# Patient Record
Sex: Female | Born: 1990 | Race: White | Hispanic: No | Marital: Single | Smoking: Current every day smoker
Health system: Southern US, Community
[De-identification: ages and names within clinical notes are randomized; demographics above are authoritative.]

## PROBLEM LIST (undated history)

## (undated) HISTORY — PX: TONSILLECTOMY: SUR1361

---

## 2016-03-21 ENCOUNTER — Emergency Department (HOSPITAL_BASED_OUTPATIENT_CLINIC_OR_DEPARTMENT_OTHER)
Admission: EM | Admit: 2016-03-21 | Discharge: 2016-03-21 | Disposition: A | Discharge status: Home / Self Care | Payer: Self-pay | Attending: Emergency Medicine | Admitting: Emergency Medicine

## 2016-03-21 ENCOUNTER — Encounter (HOSPITAL_BASED_OUTPATIENT_CLINIC_OR_DEPARTMENT_OTHER): Payer: Self-pay | Admitting: *Deleted

## 2016-03-21 DIAGNOSIS — F17200 Nicotine dependence, unspecified, uncomplicated: Secondary | ICD-10-CM | POA: Insufficient documentation

## 2016-03-21 DIAGNOSIS — M545 Low back pain, unspecified: Secondary | ICD-10-CM

## 2016-03-21 LAB — URINALYSIS, ROUTINE W REFLEX MICROSCOPIC
BILIRUBIN URINE: NEGATIVE
Glucose, UA: NEGATIVE mg/dL
Hgb urine dipstick: NEGATIVE
KETONES UR: NEGATIVE mg/dL
NITRITE: NEGATIVE
PH: 6 (ref 5.0–8.0)
PROTEIN: NEGATIVE mg/dL
Specific Gravity, Urine: 1.017 (ref 1.005–1.030)

## 2016-03-21 LAB — URINALYSIS, MICROSCOPIC (REFLEX)

## 2016-03-21 MED ORDER — NAPROXEN 500 MG PO TABS: 500.0000 mg | ORAL_TABLET | Freq: Two times a day (BID) | ORAL | 1 refills | Status: AC

## 2016-03-21 MED ORDER — HYDROCODONE-ACETAMINOPHEN 5-325 MG PO TABS: 1.0000 | ORAL_TABLET | Freq: Four times a day (QID) | ORAL | 0 refills | Status: AC | PRN

## 2016-03-21 MED ORDER — CYCLOBENZAPRINE HCL 10 MG PO TABS: 10.0000 mg | ORAL_TABLET | Freq: Two times a day (BID) | ORAL | 0 refills | Status: AC | PRN

## 2016-03-21 NOTE — ED Triage Notes (Signed)
3 months ago her back started hurting. Pain goes into her right leg.

## 2016-03-21 NOTE — Discharge Instructions (Signed)
Take the Flexeril as directed. Take the full strength Naprosyn which is double what she been taken over-the-counter. Supplement with the hydrocodone as needed for pain. Rest off your feet as much as possible. Follow-up if not improving over the next week.

## 2016-03-21 NOTE — ED Provider Notes (Signed)
[facility]   CSN: [id number] Arrival date & time: 03/21/16  1552 By signing my name below, I, Denise Macias, attest that this documentation has been prepared under the direction and in the presence of Vanetta MuldersScott Nikolai Wilczak, MD . Electronically Signed: Levon HedgerElizabeth Macias, Scribe. 03/21/2016. 6:34 PM.   History   Chief Complaint Chief Complaint  Patient presents with  . Back Pain    HPI Denise Macias is a 26 y.o. female who presents to the Emergency Department complaining of persistent, gradually worsening, mioderate right lower back pain that does not radiate onset 10 weeks ago. Per pt, pain is worse at night while trying to sleep. She has taken aleve with no relief. No hx of back pain. Pt also reports associated headache. She denies use of blood thinners. Pt denies any fever, chills, rhinorrhea, sore throat, cough, visual disturbance, SOB, CP, n/v/d, abdominal pain, dysuria, hematuria, back pain, joint swelling, or headaches., weakness, or numbness. Pt has no other complaints or symptoms at this time.   The history is provided by the patient. No language interpreter was used.   History reviewed. No pertinent past medical history.  There are no active problems to display for this patient.  Past Surgical History:  Procedure Laterality Date  . TONSILLECTOMY      OB History    No data available      Home Medications    Prior to Admission medications   Medication Sig Start Date End Date Taking? Authorizing Provider  cyclobenzaprine (FLEXERIL) 10 MG tablet Take 1 tablet (10 mg total) by mouth 2 (two) times daily as needed for muscle spasms. 03/21/16   Vanetta MuldersScott Alizandra Loh, MD  HYDROcodone-acetaminophen (NORCO/VICODIN) 5-325 MG tablet Take 1-2 tablets by mouth every 6 (six) hours as needed for moderate pain. 03/21/16   Vanetta MuldersScott Adler Chartrand, MD  naproxen (NAPROSYN) 500 MG tablet Take 1 tablet (500 mg total) by mouth 2 (two) times daily. 03/21/16   Vanetta MuldersScott Keyani Rigdon, MD   Family  History No family history on file.  Social History Social History  Substance Use Topics  . Smoking status: Current Every Day Smoker  . Smokeless tobacco: Never Used  . Alcohol use Yes    Allergies   Patient has no known allergies.  Review of Systems Review of Systems  Constitutional: Negative for chills and fever.  HENT: Negative for rhinorrhea, sneezing and sore throat.   Eyes: Negative for visual disturbance.  Respiratory: Negative for cough and shortness of breath.   Cardiovascular: Negative for chest pain.  Gastrointestinal: Negative for abdominal pain, diarrhea, nausea and vomiting.  Genitourinary: Negative for dysuria and hematuria.  Musculoskeletal: Negative for back pain and joint swelling.  Skin: Negative for rash.  Neurological: Negative for headaches.  Hematological: Does not bruise/bleed easily.  Psychiatric/Behavioral: Negative for confusion.   Physical Exam Updated Vital Signs BP 136/91 (BP Location: Right Arm)   Pulse 87   Temp 98.3 F (36.8 C) (Oral)   Resp 18   Ht 5\' 5"  (1.651 m)   Wt 111.6 kg   LMP 02/25/2016   SpO2 100%   BMI 40.94 kg/m   Physical Exam  Constitutional: She is oriented to person, place, and time. She appears well-developed and well-nourished. No distress.  HENT:  Head: Normocephalic and atraumatic.  Moist mucus membranes  Eyes: Conjunctivae and EOM are normal. Pupils are equal, round, and reactive to light. No scleral icterus.  Cardiovascular: Normal rate and regular rhythm.   Pulmonary/Chest: Effort normal. No respiratory distress.  Abdominal: Soft.  Bowel sounds are normal. She exhibits no distension. There is no tenderness.  Musculoskeletal:  Lower midline lumbar tenderness  Neurological: She is alert and oriented to person, place, and time. No cranial nerve deficit or sensory deficit. She exhibits normal muscle tone. Coordination normal.  Skin: Skin is warm and dry.  Psychiatric: She has a normal mood and affect.  Nursing  note and vitals reviewed.  ED Treatments / Results  DIAGNOSTIC STUDIES:  Oxygen Saturation is 100% on RA, normal by my interpretation.    COORDINATION OF CARE:  6:25 PM Discussed treatment plan with pt at bedside and pt agreed to plan.  Labs (all labs ordered are listed, but only abnormal results are displayed) Labs Reviewed  URINALYSIS, ROUTINE W REFLEX MICROSCOPIC - Abnormal; Notable for the following:       Result Value   APPearance CLOUDY (*)    Leukocytes, UA SMALL (*)    All other components within normal limits  URINALYSIS, MICROSCOPIC (REFLEX) - Abnormal; Notable for the following:    Bacteria, UA MANY (*)    Squamous Epithelial / LPF 0-5 (*)    All other components within normal limits    EKG  EKG Interpretation None       Radiology No results found.  Procedures Procedures (including critical care time)  Medications Ordered in ED Medications - No data to display   Initial Impression / Assessment and Plan / ED Course  I have reviewed the triage vital signs and the nursing notes.  Pertinent labs & imaging results that were available during my care of the patient were reviewed by me and considered in my medical decision making (see chart for details).     Patient without evidence of any sciatica. Symptoms consistent with right-sided lower back pain. Will treat symptomatically. With rest and anti-inflammatories pain medicine and muscle relaxer. Work note provided.  Final Clinical Impressions(s) / ED Diagnoses   Final diagnoses:  Acute right-sided low back pain without sciatica    New Prescriptions New Prescriptions   CYCLOBENZAPRINE (FLEXERIL) 10 MG TABLET    Take 1 tablet (10 mg total) by mouth 2 (two) times daily as needed for muscle spasms.   HYDROCODONE-ACETAMINOPHEN (NORCO/VICODIN) 5-325 MG TABLET    Take 1-2 tablets by mouth every 6 (six) hours as needed for moderate pain.   NAPROXEN (NAPROSYN) 500 MG TABLET    Take 1 tablet (500 mg total) by  mouth 2 (two) times daily.   I personally performed the services described in this documentation, which was scribed in my presence. The recorded information has been reviewed and is accurate.      Vanetta Mulders, MD 03/21/16 Paulo Fruit

## 2016-03-21 NOTE — ED Notes (Addendum)
Pt. Sitting on edge of bed and visitor standing in door way.  In to explain reason for wait and that I have not been in to see her.  Pt. Appologized for her behavior.

## 2016-05-24 ENCOUNTER — Emergency Department (HOSPITAL_COMMUNITY): Payer: Self-pay

## 2016-05-24 ENCOUNTER — Emergency Department (HOSPITAL_COMMUNITY)
Admission: EM | Admit: 2016-05-24 | Discharge: 2016-05-24 | Disposition: A | Discharge status: Home / Self Care | Payer: Self-pay | Attending: Emergency Medicine | Admitting: Emergency Medicine

## 2016-05-24 ENCOUNTER — Encounter (HOSPITAL_COMMUNITY): Payer: Self-pay

## 2016-05-24 DIAGNOSIS — Z79899 Other long term (current) drug therapy: Secondary | ICD-10-CM | POA: Insufficient documentation

## 2016-05-24 DIAGNOSIS — F17200 Nicotine dependence, unspecified, uncomplicated: Secondary | ICD-10-CM | POA: Insufficient documentation

## 2016-05-24 DIAGNOSIS — S93401A Sprain of unspecified ligament of right ankle, initial encounter: Secondary | ICD-10-CM | POA: Insufficient documentation

## 2016-05-24 DIAGNOSIS — Y92009 Unspecified place in unspecified non-institutional (private) residence as the place of occurrence of the external cause: Secondary | ICD-10-CM | POA: Insufficient documentation

## 2016-05-24 DIAGNOSIS — M25571 Pain in right ankle and joints of right foot: Secondary | ICD-10-CM

## 2016-05-24 DIAGNOSIS — W109XXA Fall (on) (from) unspecified stairs and steps, initial encounter: Secondary | ICD-10-CM | POA: Insufficient documentation

## 2016-05-24 DIAGNOSIS — Y999 Unspecified external cause status: Secondary | ICD-10-CM | POA: Insufficient documentation

## 2016-05-24 DIAGNOSIS — Y9301 Activity, walking, marching and hiking: Secondary | ICD-10-CM | POA: Insufficient documentation

## 2016-05-24 MED ORDER — HYDROCODONE-ACETAMINOPHEN 5-325 MG PO TABS
1.0000 | ORAL_TABLET | Freq: Once | ORAL | Status: AC
  Administered 2016-05-24: 1 via ORAL
  Filled 2016-05-24: qty 1

## 2016-05-24 MED ORDER — HYDROCODONE-ACETAMINOPHEN 5-325 MG PO TABS: 1.0000 | ORAL_TABLET | Freq: Four times a day (QID) | ORAL | 0 refills | Status: AC | PRN

## 2016-05-24 MED ORDER — NAPROXEN 500 MG PO TABS: 500.0000 mg | ORAL_TABLET | Freq: Two times a day (BID) | ORAL | 0 refills | Status: AC | PRN

## 2016-05-24 NOTE — ED Provider Notes (Signed)
[facility]-EMERGENCY DEPT Provider Note   CSN: [id number] Arrival date & time: 05/24/16  1207   By signing my name below, I, Clarisse Gouge, attest that this documentation has been prepared under the direction and in the presence of [address], [employer]. Electronically Signed: Clarisse Gouge, Scribe. 05/24/16. 12:44 PM.  History   Chief Complaint Chief Complaint  Patient presents with  . Ankle Pain    RIGHT   The history is provided by the patient and medical records. No language interpreter was used.  Ankle Pain   The incident occurred less than 1 hour ago. The incident occurred at home. The injury mechanism was a fall and torsion. The pain is present in the right ankle. The quality of the pain is described as throbbing. The pain is at a severity of 8/10. The pain is severe. The pain has been intermittent since onset. Associated symptoms include inability to bear weight, loss of motion (due to pain) and tingling. Pertinent negatives include no numbness, no muscle weakness and no loss of sensation. She reports no foreign bodies present. The symptoms are aggravated by bearing weight. She has tried nothing for the symptoms. The treatment provided no relief.    HPI Comments: Denise Macias is a 26 y.o. female who presents to the Emergency Department complaining of mechanical fall off the last step on a staircase when she twisted her ankle walking down the stairs ~1hr PTA, with associated sudden onset R ankle pain. She states she rolled her R ankle inward while stepping forward onto the last step of a stairway. She reports the pain is 8/10, intermittent, throbbing nonradiating R ankle pain worsened with placing pressure on the heel and weight bearing,  and with no tx tried PTA. She reports associated R ankle swelling, R ankle bruising, and mild tingling in her R foot/toes, as well as pain with ambulation and ROM of the ankle. She adds Hx of a broken L ankle, no prior injury to the R ankle. Pt denies  head trauma, LOC, knee pain, other arthralgias/injuries, neck/back pain, abrasions, numbness, focal weakness, or any other complaints at this time. NKDA.   History reviewed. No pertinent past medical history.  There are no active problems to display for this patient.   Past Surgical History:  Procedure Laterality Date  . TONSILLECTOMY      OB History    No data available       Home Medications    Prior to Admission medications   Medication Sig Start Date End Date Taking? Authorizing Provider  cyclobenzaprine (FLEXERIL) 10 MG tablet Take 1 tablet (10 mg total) by mouth 2 (two) times daily as needed for muscle spasms. 03/21/16   Vanetta Mulders, MD  HYDROcodone-acetaminophen (NORCO/VICODIN) 5-325 MG tablet Take 1-2 tablets by mouth every 6 (six) hours as needed for moderate pain. 03/21/16   Vanetta Mulders, MD  naproxen (NAPROSYN) 500 MG tablet Take 1 tablet (500 mg total) by mouth 2 (two) times daily. 03/21/16   Vanetta Mulders, MD    Family History History reviewed. No pertinent family history.  Social History Social History  Substance Use Topics  . Smoking status: Current Every Day Smoker  . Smokeless tobacco: Never Used  . Alcohol use Yes     Allergies   Patient has no known allergies.   Review of Systems Review of Systems  HENT: Negative for facial swelling (no head injury).   Musculoskeletal: Positive for arthralgias (R ankle) and joint swelling (R ankle). Negative for back pain, myalgias and  neck pain.  Skin: Positive for color change (bruising R ankle). Negative for wound.  Allergic/Immunologic: Negative for immunocompromised state.  Neurological: Positive for tingling. Negative for syncope, weakness and numbness.  Psychiatric/Behavioral: Negative for confusion.  10 Systems reviewed and are negative for acute change except as noted in the HPI.    Physical Exam Updated Vital Signs BP (!) 153/97   Pulse (!) 116   Temp 98.5 F (36.9 C) (Oral)   Resp 18    Ht  (1.676 m)   Wt 250 lb (113.4 kg)   LMP 05/16/2016   SpO2 98%   BMI 40.35 kg/m   Physical Exam  Constitutional: She is oriented to person, place, and time. Vital signs are normal. She appears well-developed and well-nourished.  Non-toxic appearance. No distress.  Afebrile, nontoxic, NAD although tearful during ankle exam d/t pain  HENT:  Head: Normocephalic and atraumatic.  Mouth/Throat: Mucous membranes are normal.  Eyes: Conjunctivae and EOM are normal. Right eye exhibits no discharge. Left eye exhibits no discharge.  Neck: Normal range of motion. Neck supple.  Cardiovascular: Intact distal pulses.  Tachycardia present.   Mildly tachycardic likely d/t pain  Pulmonary/Chest: Effort normal. No respiratory distress.  Abdominal: Normal appearance. She exhibits no distension.  Musculoskeletal:       Right ankle: She exhibits decreased range of motion (d/t pain), swelling and ecchymosis. She exhibits no deformity, no laceration and normal pulse. Tenderness. Lateral malleolus and medial malleolus tenderness found. Achilles tendon normal.  R ankle with limited ROM d/t pain, +swelling, no crepitus or deformity, with moderate TTP of the lateral and medial malleoli, but no TTP or swelling of fore foot or calf. No break in skin. +bruising without erythema. No warmth. Achilles intact. Good pedal pulse and cap refill of all toes. Wiggling toes without difficulty. Sensation grossly intact. Soft compartments   Neurological: She is alert and oriented to person, place, and time. She has normal strength. No sensory deficit.  Skin: Skin is warm, dry and intact. No rash noted.  Psychiatric: She has a normal mood and affect. Her behavior is normal.  Nursing note and vitals reviewed.    ED Treatments / Results  DIAGNOSTIC STUDIES: Oxygen Saturation is 98% on RA, normal by my interpretation.    COORDINATION OF CARE: 12:40 PM Discussed treatment plan with pt at bedside and pt agreed to plan. Will  order medication and imaging.  Labs (all labs ordered are listed, but only abnormal results are displayed) Labs Reviewed - No data to display  EKG  EKG Interpretation None       Radiology Dg Ankle Complete Right  Result Date: 05/24/2016 CLINICAL DATA:  Pain and swelling of right ankle.  Rolled ankle. EXAM: RIGHT ANKLE - COMPLETE 3+ VIEW COMPARISON:  None. FINDINGS: Lateral soft tissue swelling. No fracture, subluxation or dislocation. Joint spaces are maintained. IMPRESSION: No bony abnormality. Electronically Signed   By: Charlett Nose M.D.   On: 05/24/2016 13:02    Procedures Procedures (including critical care time)  Medications Ordered in ED Medications  HYDROcodone-acetaminophen (NORCO/VICODIN) 5-325 MG per tablet 1 tablet (1 tablet Oral Given 05/24/16 1240)     Initial Impression / Assessment and Plan / ED Course  I have reviewed the triage vital signs and the nursing notes.  Pertinent labs & imaging results that were available during my care of the patient were reviewed by me and considered in my medical decision making (see chart for details).     26 y.o. female  here with R ankle pain after twisting it coming down off last step on staircase. No other injury sustained. +Swelling and bruising to lateral malleolus, moderate TTP diffusely to ankle, none to foot or calf. No break in skin. NVI with soft compartments. Will give pain meds and obtain R ankle xray then reassess after. Of note, triage nurse ordered FOOT xray as well, which I have tried to cancel but it's listed as already scheduled; called radiology to cancel the foot xray and they reported they would cancel this now.   1:19 PM Xray of ANKLE negative for acute injury. Unfortunately the radiology dept had already performed the foot xray, and accidentally cancelled the ankle xray initially then re-ordered it; radiology tech reporting to me that she will advise the dept of this error and it will NOT be charged to the pt  since this was an error with the tech cancelling the wrong order. This result is not on file given that it will not be charged for and was done in error. Overall, likely sprain. Will place in ASO brace for support and give crutches. RICE advised. Rx for pain meds given. [place] reviewed prior to dispensing controlled substance medications, and was notable for: vicodin #10 [place] 03/21/16, no other controlled substances found. Risks/benefits/alternatives and expectations discussed regarding controlled substances. Side effects of medications discussed. Informed consent obtained. F/up with ortho in 1-2wks for recheck of symptoms. I explained the diagnosis and have given explicit precautions to return to the ER including for any other new or worsening symptoms. The patient understands and accepts the medical plan as it's been dictated and I have answered their questions. Discharge instructions concerning home care and prescriptions have been given. The patient is STABLE and is discharged to home in good condition.   I personally performed the services described in this documentation, which was scribed in my presence. The recorded information has been reviewed and is accurate.   Final Clinical Impressions(s) / ED Diagnoses   Final diagnoses:  Sprain of right ankle, unspecified ligament, initial encounter  Acute right ankle pain    New Prescriptions New Prescriptions   HYDROCODONE-ACETAMINOPHEN (NORCO) 5-325 MG TABLET    Take 1 tablet by mouth every 6 (six) hours as needed for severe pain.   NAPROXEN (NAPROSYN) 500 MG TABLET    Take 1 tablet (500 mg total) by mouth 2 (two) times daily as needed for mild pain, moderate pain or headache (TAKE WITH MEALS.).     [address], PA-C 05/24/16 1320    Azalia Bilis, MD 05/24/16 [phone]

## 2016-05-24 NOTE — ED Notes (Signed)
Patient transported to X-ray 

## 2016-05-24 NOTE — Progress Notes (Signed)
Orthopedic Tech Progress Note Patient Details:  Denise Macias January 14, 1991 [id number]  Ortho Devices Type of Ortho Device: ASO, Crutches Ortho Device/Splint Interventions: Ordered, Application, Adjustment   Clois Dupes 05/24/2016, 1:38 PM

## 2016-05-24 NOTE — ED Triage Notes (Signed)
PT C/O PAIN AND SWELLING TO THE RIGHT ANKLE AFTER HER ANKLE ROLLED WHILE WALKING DOWN THE LAST STEP. PT STS SHE HEARD A "CRACK". DENIES HEAD INJURY OR LOC.

## 2016-05-24 NOTE — ED Notes (Signed)
PT DISCHARGED. INSTRUCTIONS AND PRESCRIPTIONS GIVEN. AAOX4. PT IN NO APPARENT DISTRESS. THE OPPORTUNITY TO ASK QUESTIONS WAS PROVIDED. 

## 2016-05-24 NOTE — Discharge Instructions (Signed)
Wear ankle brace for at least 2 weeks for stabilization of ankle. Use crutches as needed for comfort. Ice and elevate ankle throughout the day, using ice pack for no more than 20 minutes every hour.  Alternate between naprosyn and norco for pain relief. Do not drive or operate machinery with pain medication use. Call orthopedic follow up today or tomorrow to schedule followup appointment for recheck of ongoing ankle pain in 1-2 weeks that can be canceled with a 24-48 hour notice if complete resolution of pain. Return to the ER for changes or worsening symptoms. °  °

## 2018-03-31 IMAGING — CR DG FOOT COMPLETE 3+V*R*
4 series · 4 of 4 positions shown · non-contrast
Comparison: None.

CLINICAL DATA: Right foot pain following twisting injury today,
initial encounter

EXAM:
RIGHT FOOT COMPLETE - 3+ VIEW

[x foot ap right]
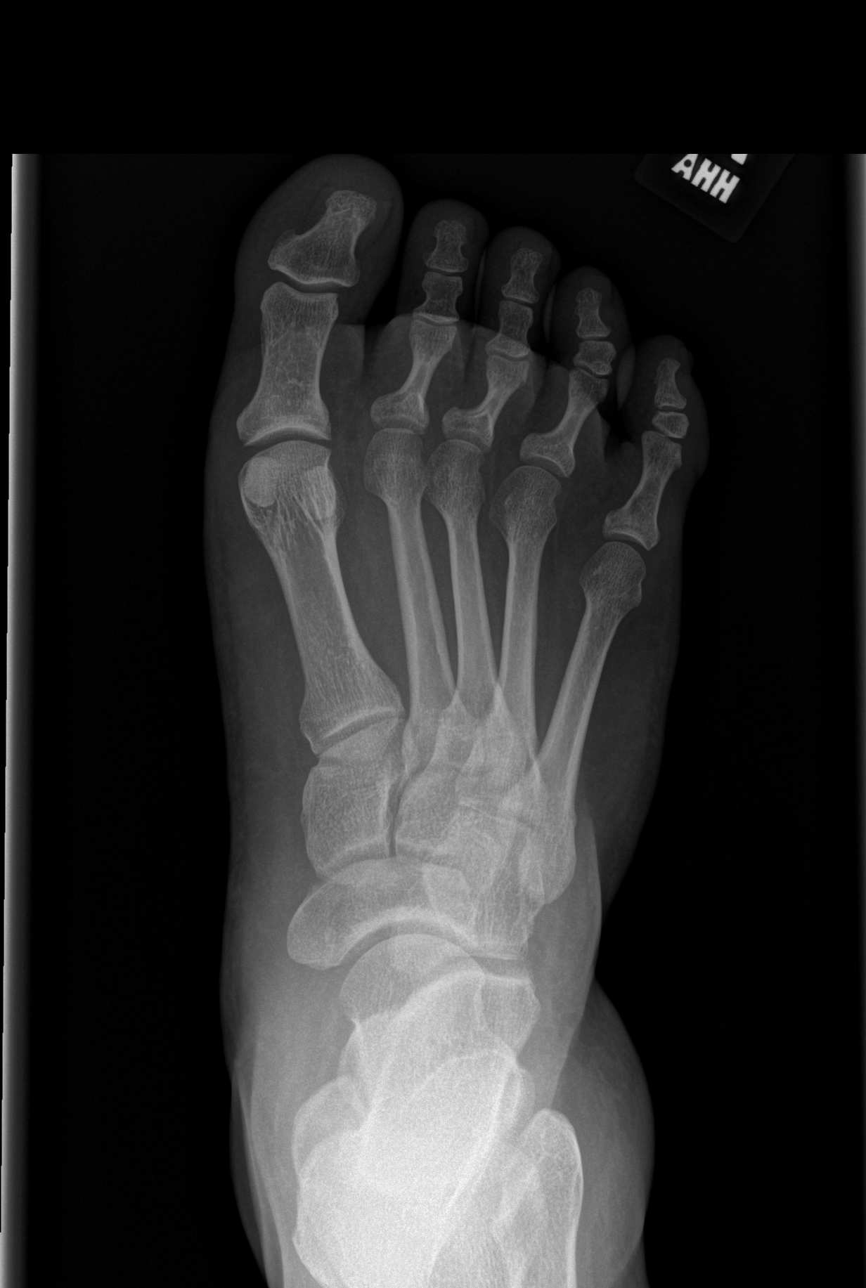

[x foot obl right (1 of 2)]
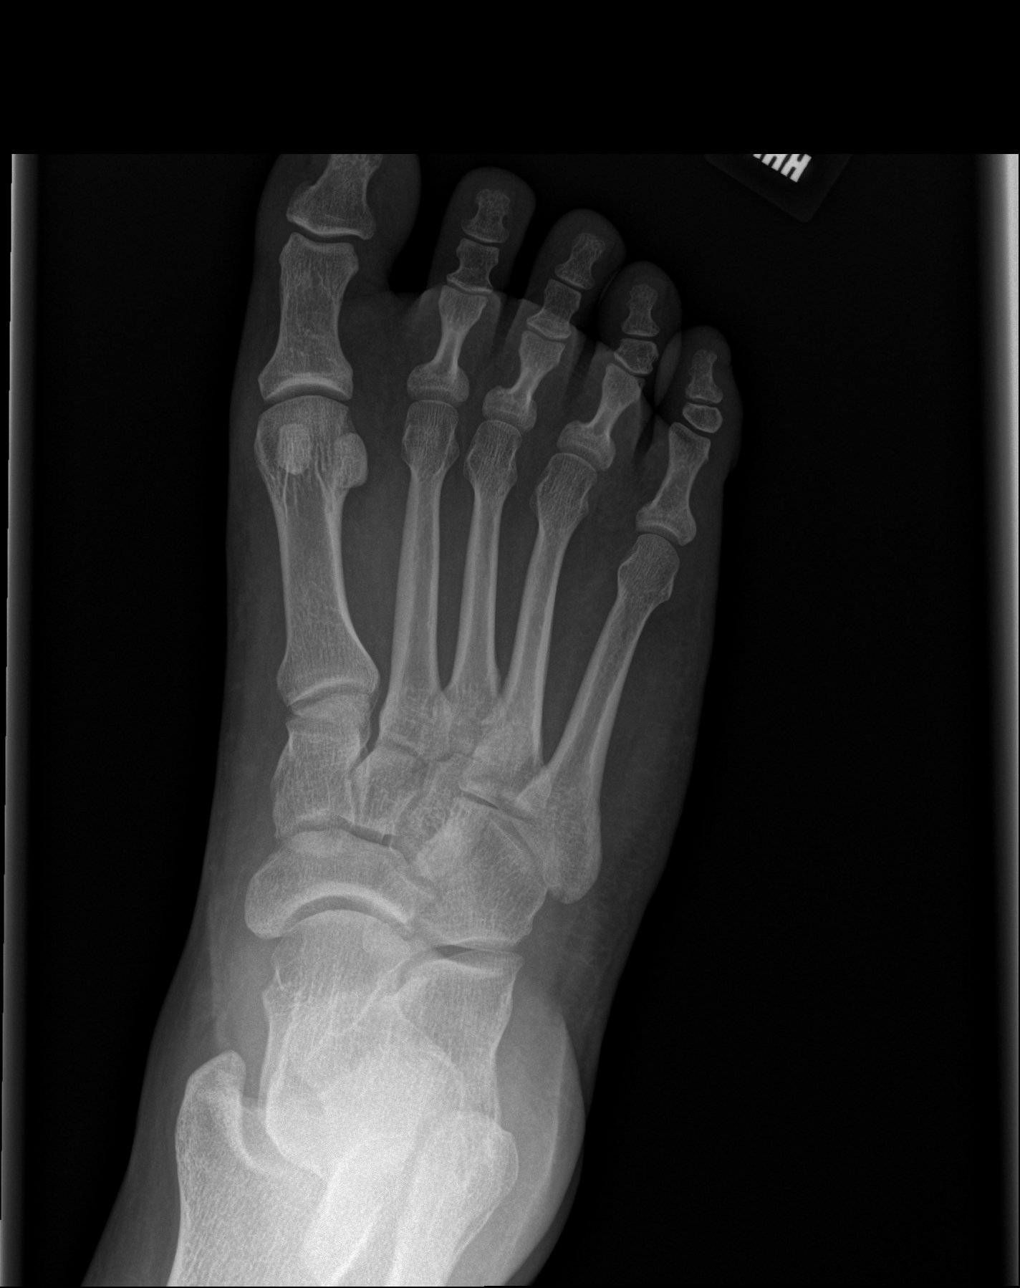

[x foot obl right (2 of 2)]
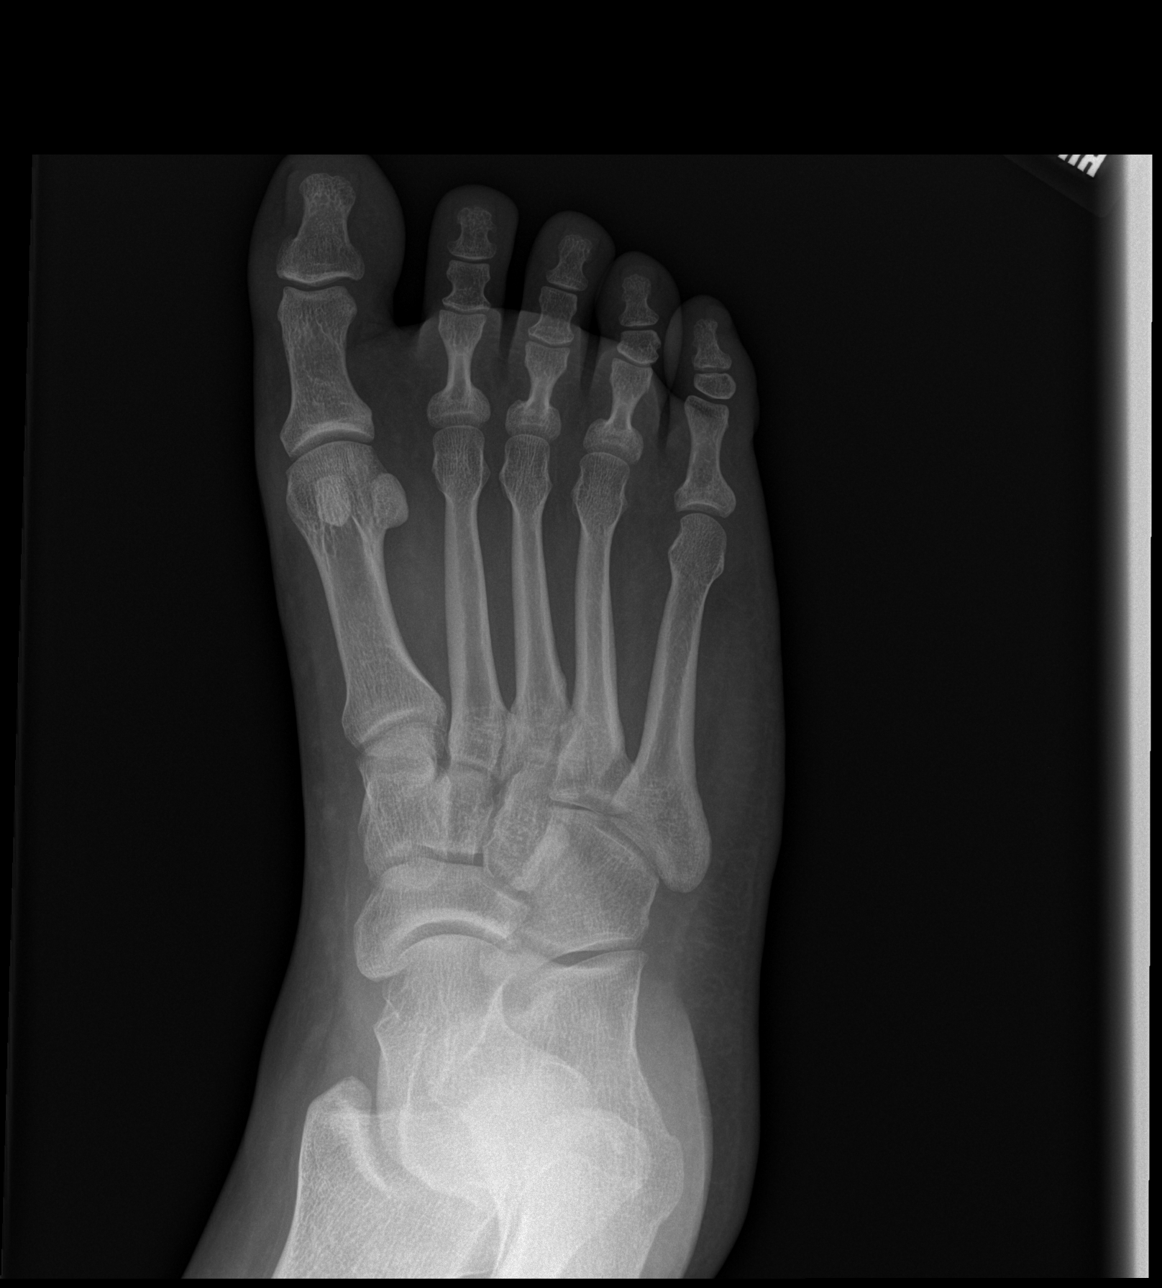

[x foot lat right]
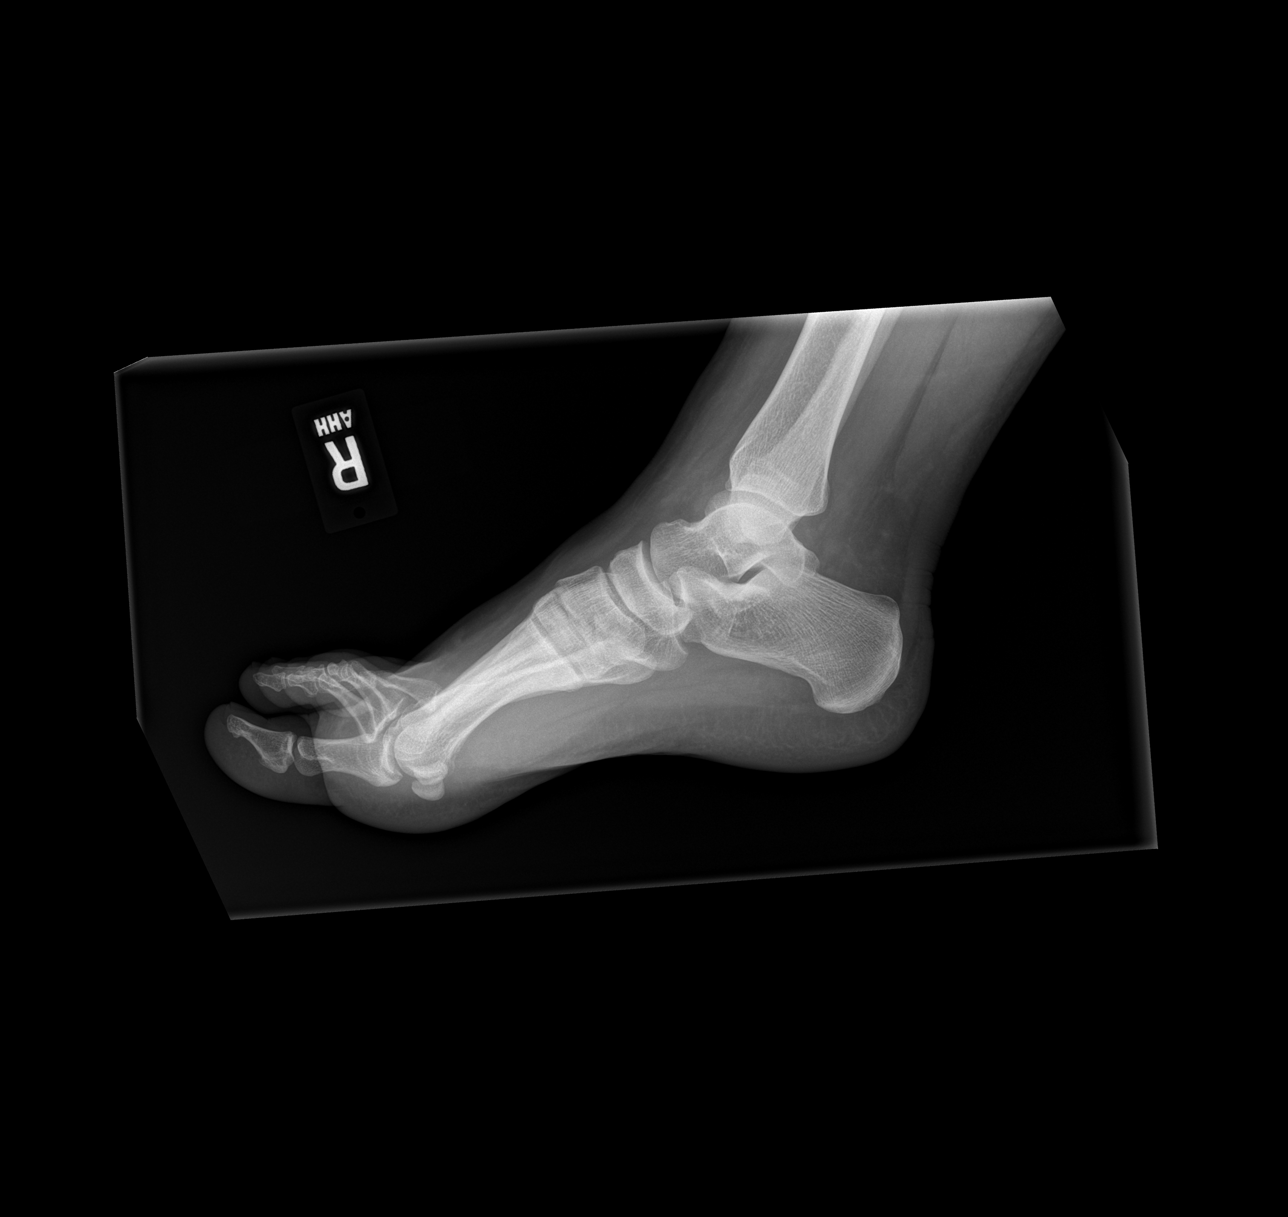

[4 of 4 positions shown; findings below may reference images not displayed]

FINDINGS: There is no evidence of fracture or dislocation. There is no
evidence of arthropathy or other focal bone abnormality. Soft
tissues are unremarkable.
IMPRESSION: No acute abnormality noted.

## 2018-03-31 IMAGING — CR DG ANKLE COMPLETE 3+V*R*
3 series · 3 of 3 positions shown · non-contrast
Comparison: None.

CLINICAL DATA: Pain and swelling of right ankle.  Rolled ankle.

EXAM:
RIGHT ANKLE - COMPLETE 3+ VIEW

[x ankle ap right]
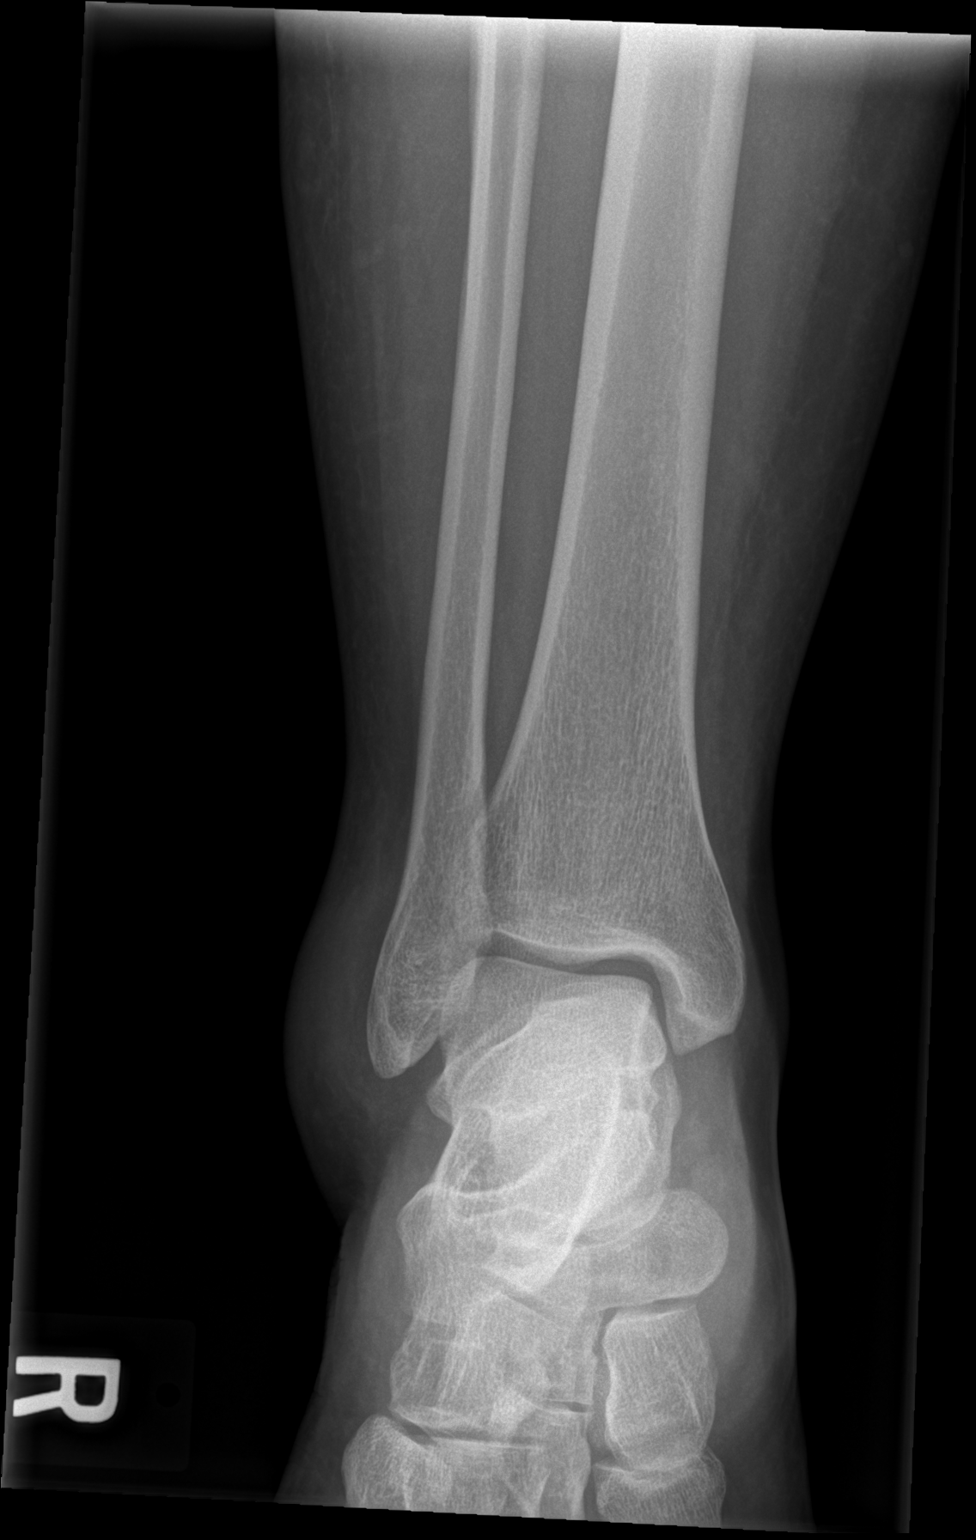

[x ankle obl right]
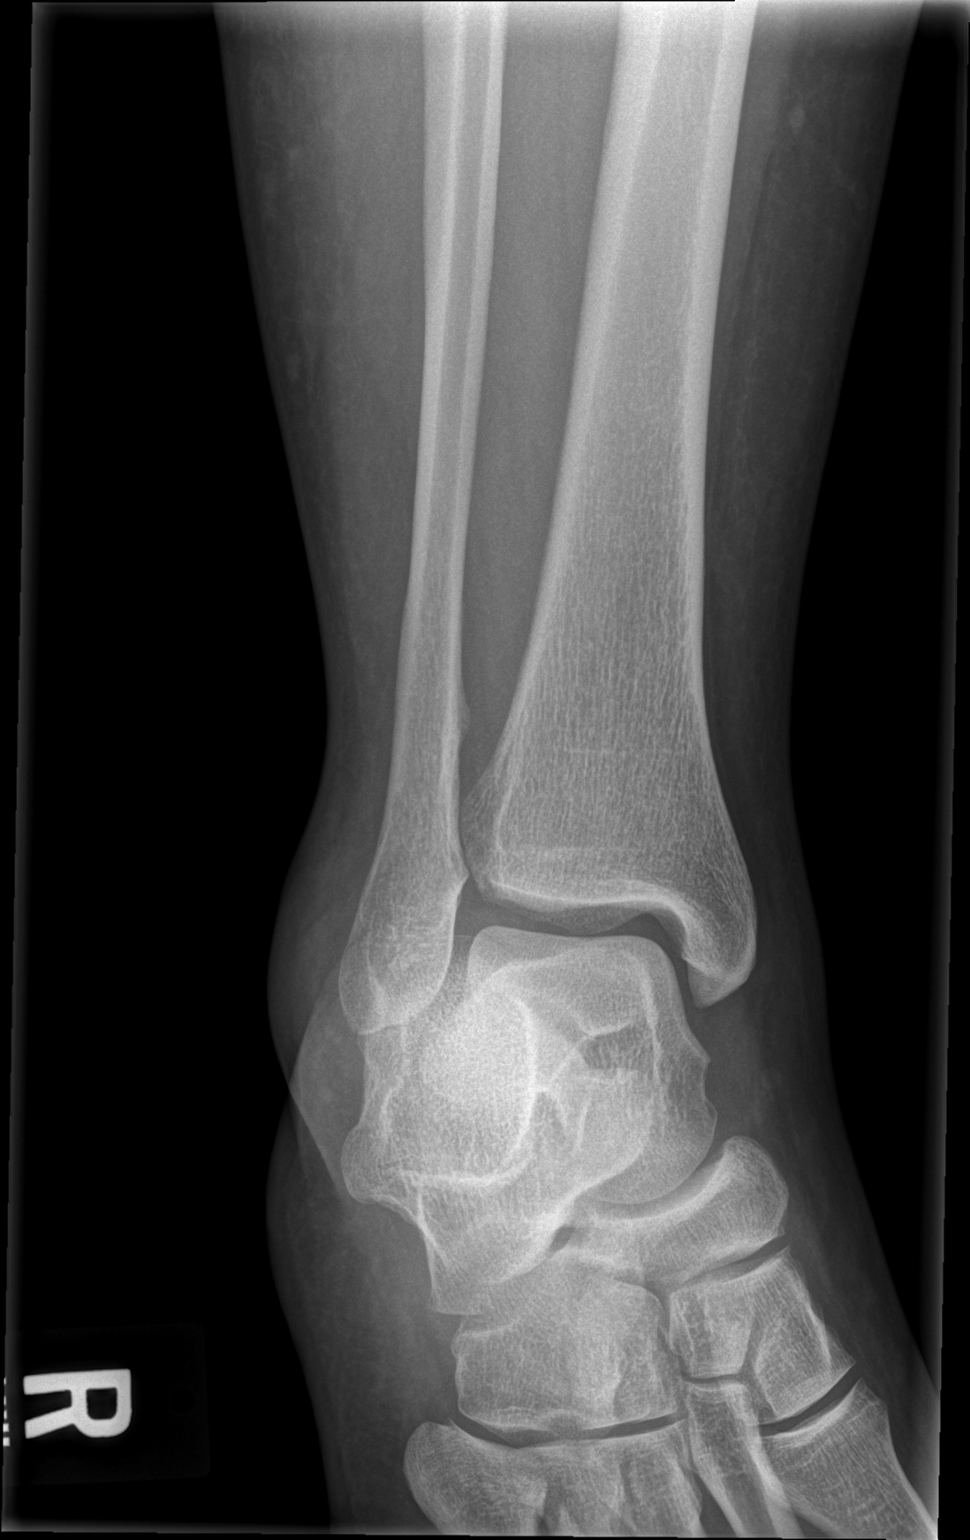

[x ankle lat right]
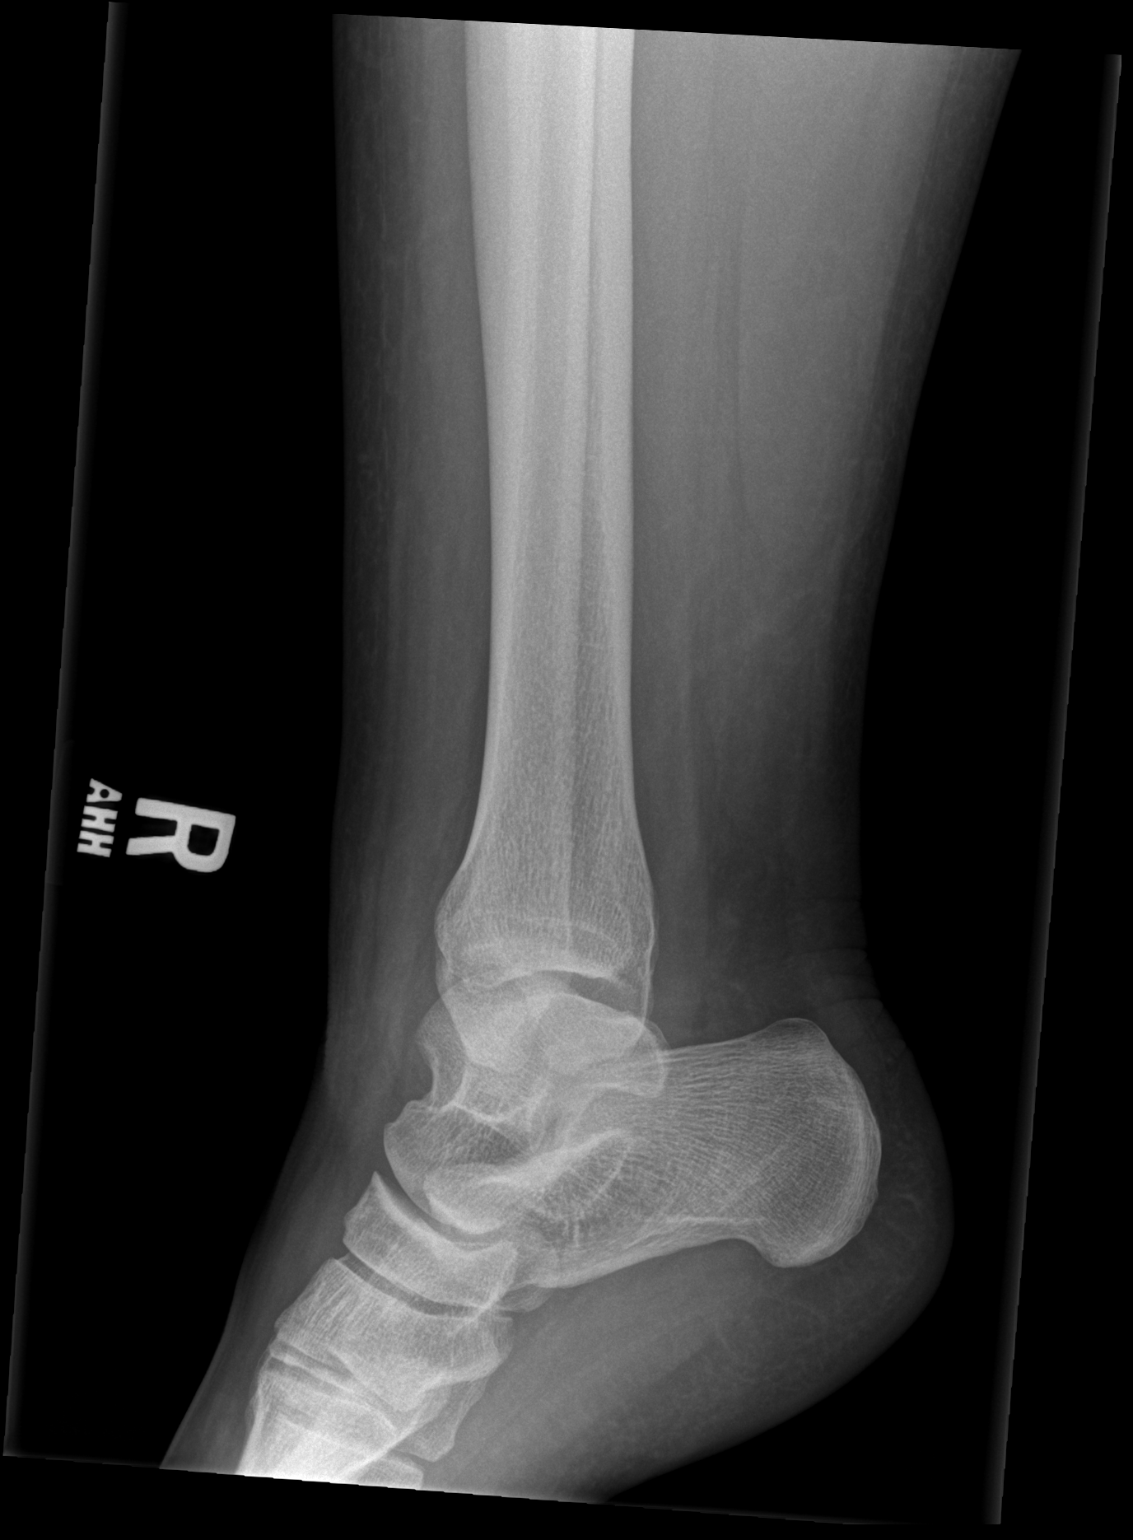

[3 of 3 positions shown; findings below may reference images not displayed]

FINDINGS: Lateral soft tissue swelling. No fracture, subluxation or
dislocation. Joint spaces are maintained.
IMPRESSION: No bony abnormality.
